# Patient Record
Sex: Female | Born: 1955 | Race: Black or African American | Hispanic: No | Marital: Married | Smoking: Never smoker
Health system: Southern US, Community
[De-identification: ages and names within clinical notes are randomized; demographics above are authoritative.]

## PROBLEM LIST (undated history)

## (undated) DIAGNOSIS — I10 Essential (primary) hypertension: Secondary | ICD-10-CM

## (undated) DIAGNOSIS — E119 Type 2 diabetes mellitus without complications: Secondary | ICD-10-CM

---

## 2002-09-09 ENCOUNTER — Encounter: Payer: Self-pay | Admitting: Obstetrics

## 2002-09-09 ENCOUNTER — Ambulatory Visit (HOSPITAL_COMMUNITY): Admission: RE | Admit: 2002-09-09 | Discharge: 2002-09-09 | Payer: Self-pay | Admitting: Obstetrics

## 2002-09-30 ENCOUNTER — Encounter: Admission: RE | Admit: 2002-09-30 | Discharge: 2002-09-30 | Payer: Self-pay | Admitting: Cardiology

## 2002-09-30 ENCOUNTER — Encounter: Payer: Self-pay | Admitting: Cardiology

## 2004-02-10 ENCOUNTER — Ambulatory Visit (HOSPITAL_COMMUNITY): Admission: RE | Admit: 2004-02-10 | Discharge: 2004-02-10 | Payer: Self-pay | Admitting: Cardiology

## 2004-02-10 IMAGING — CR DG CHEST 2V
2 series · 2 of 2 positions shown · non-contrast
Comparison: none

CLINICAL DATA: Positive TB test.
 CHEST, TWO VIEWS 
 PA and lateral views reveal the heart size to be normal.  Linear atelectasis is noted on the left.  No acute abnormality.
 IMPRESSION
 No active disease.

[view not recorded (1 of 2)]
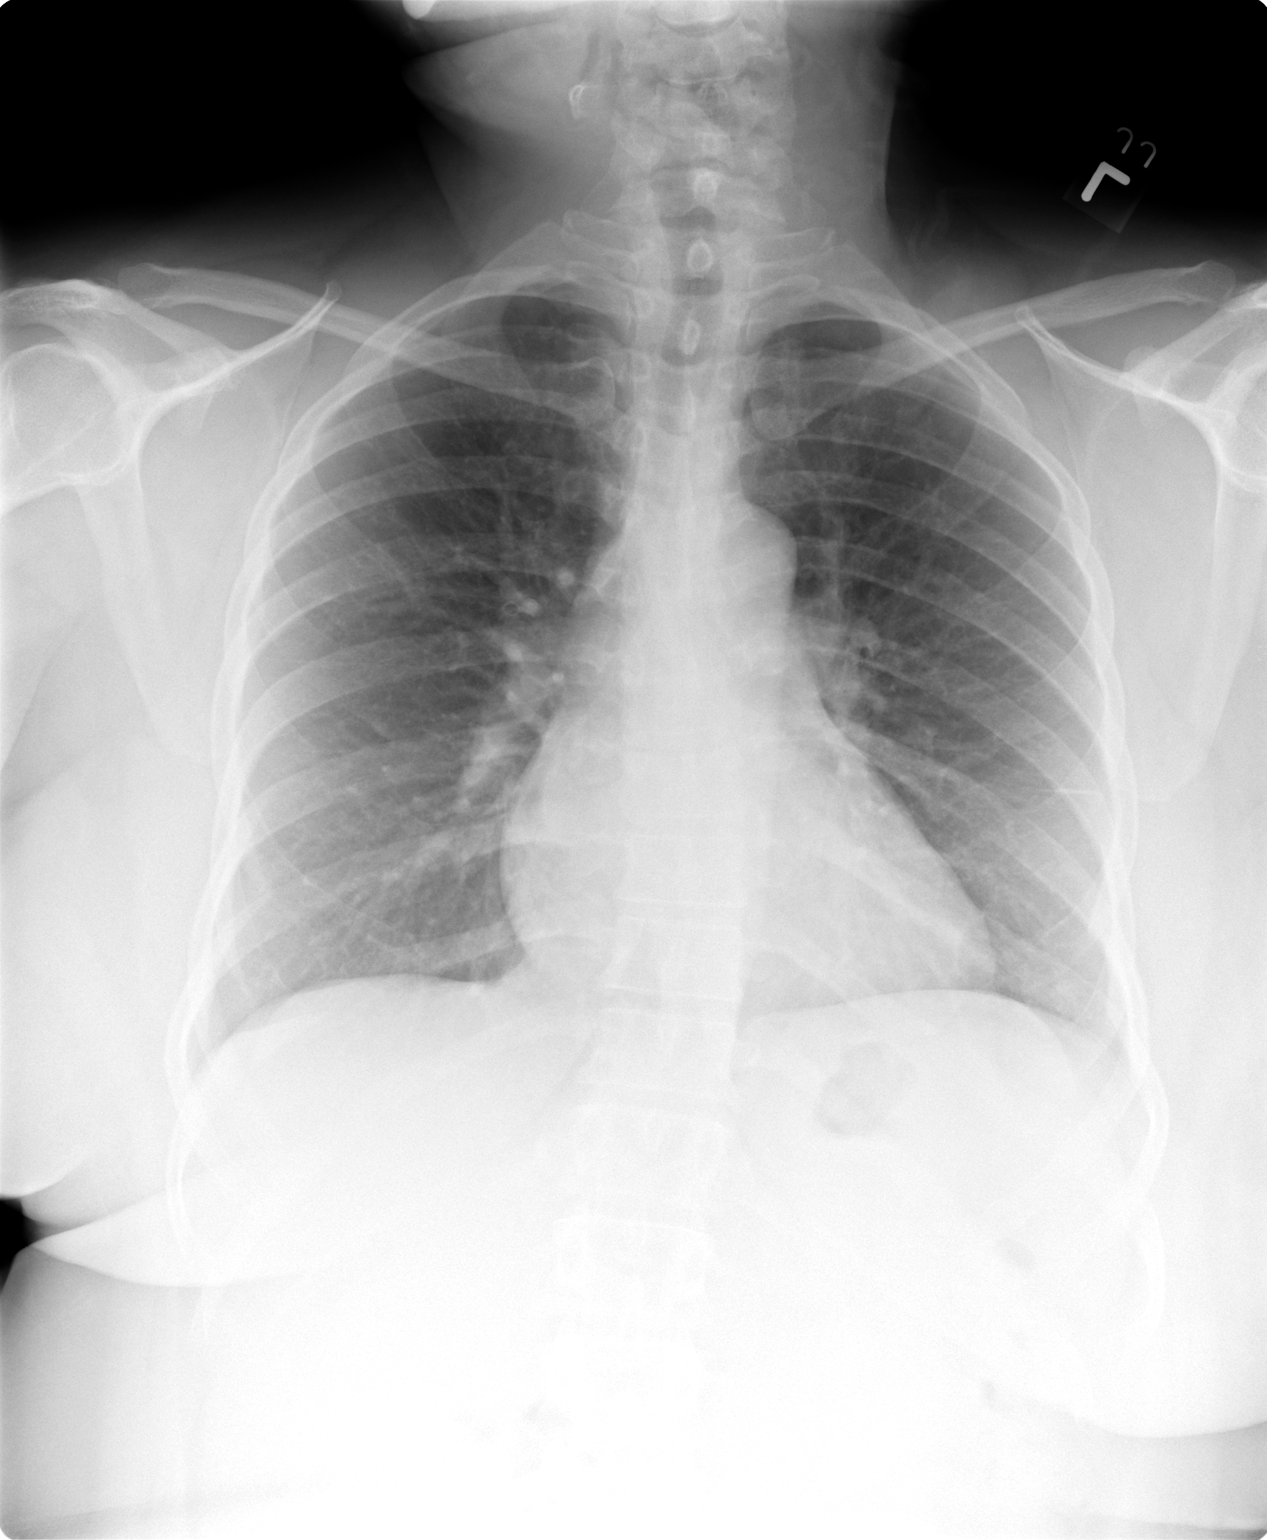

[view not recorded (2 of 2)]
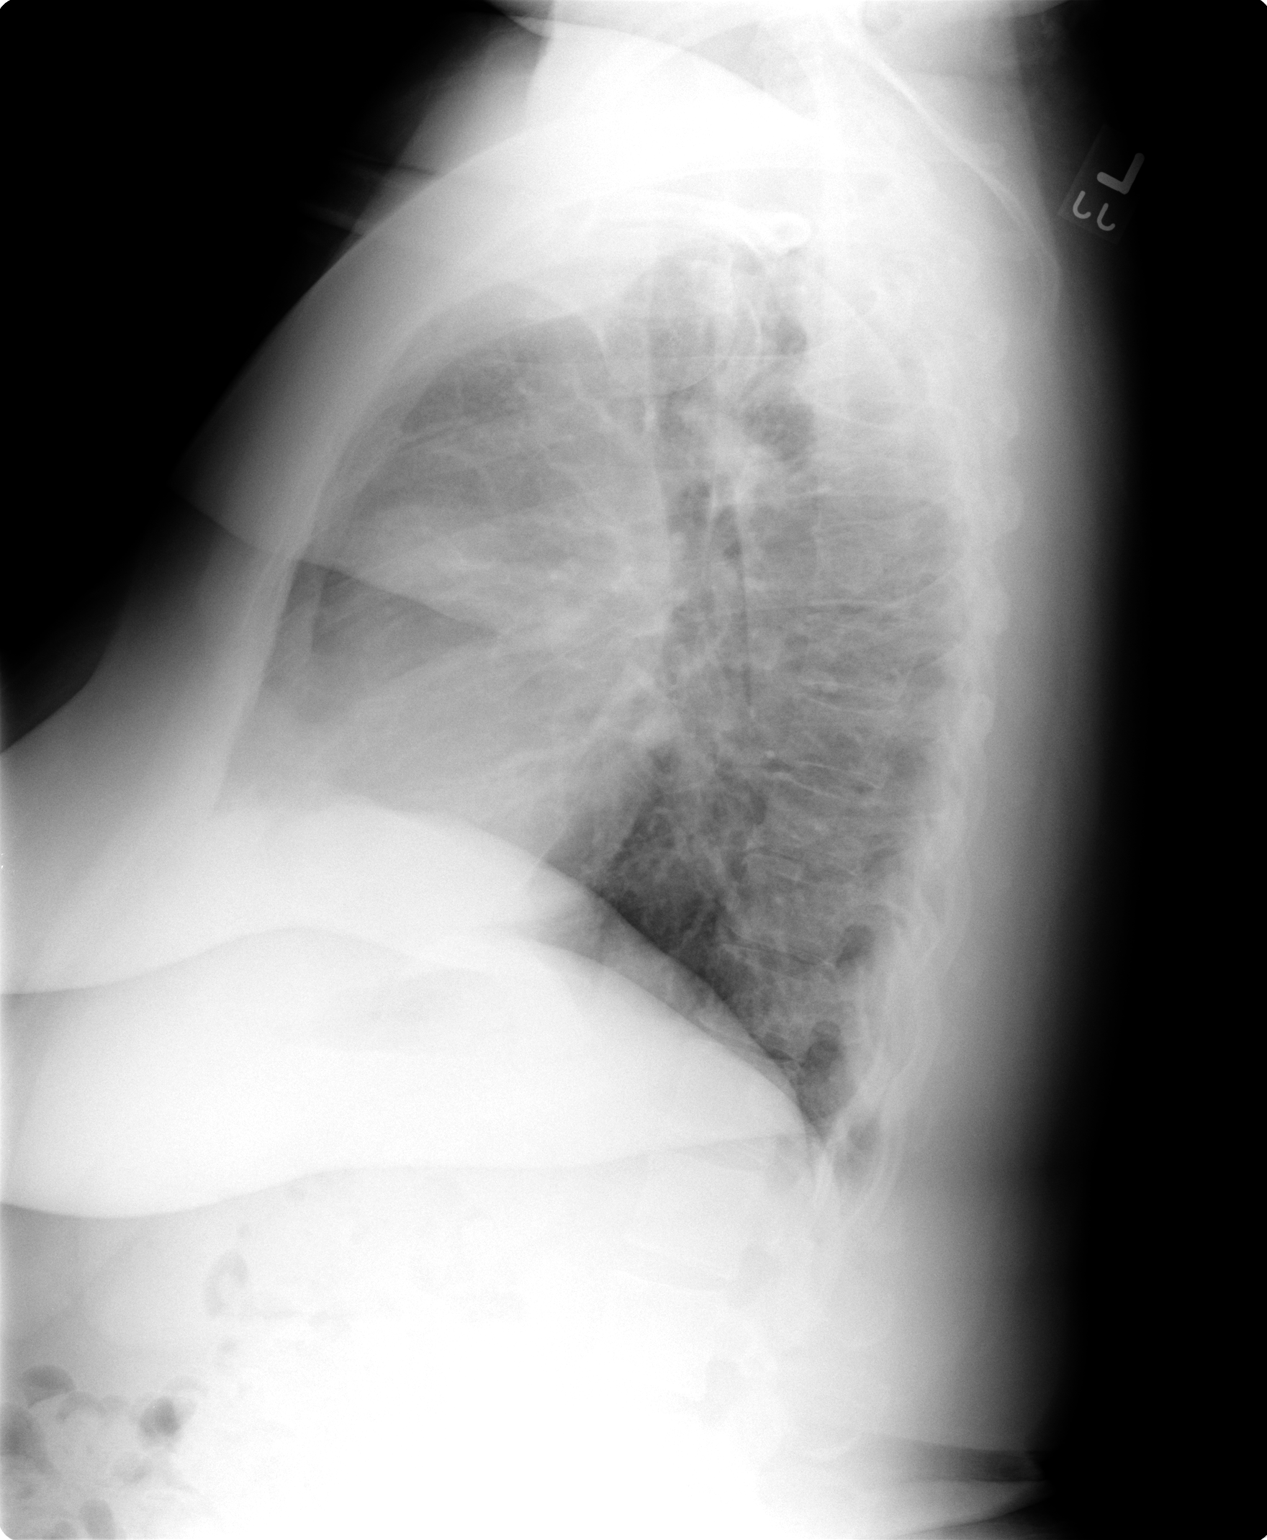

[2 of 2 positions shown; findings below may reference images not displayed]

## 2012-11-20 ENCOUNTER — Other Ambulatory Visit: Payer: Self-pay | Admitting: Occupational Medicine

## 2012-11-20 ENCOUNTER — Ambulatory Visit: Payer: Self-pay

## 2012-11-20 DIAGNOSIS — R7611 Nonspecific reaction to tuberculin skin test without active tuberculosis: Secondary | ICD-10-CM

## 2012-11-20 IMAGING — CR DG CHEST 1V
1 series · 1 of 1 positions shown · non-contrast
Comparison: [DATE]

CLINICAL DATA: Positive TB skin test

CHEST - 1 VIEW

[view not recorded]
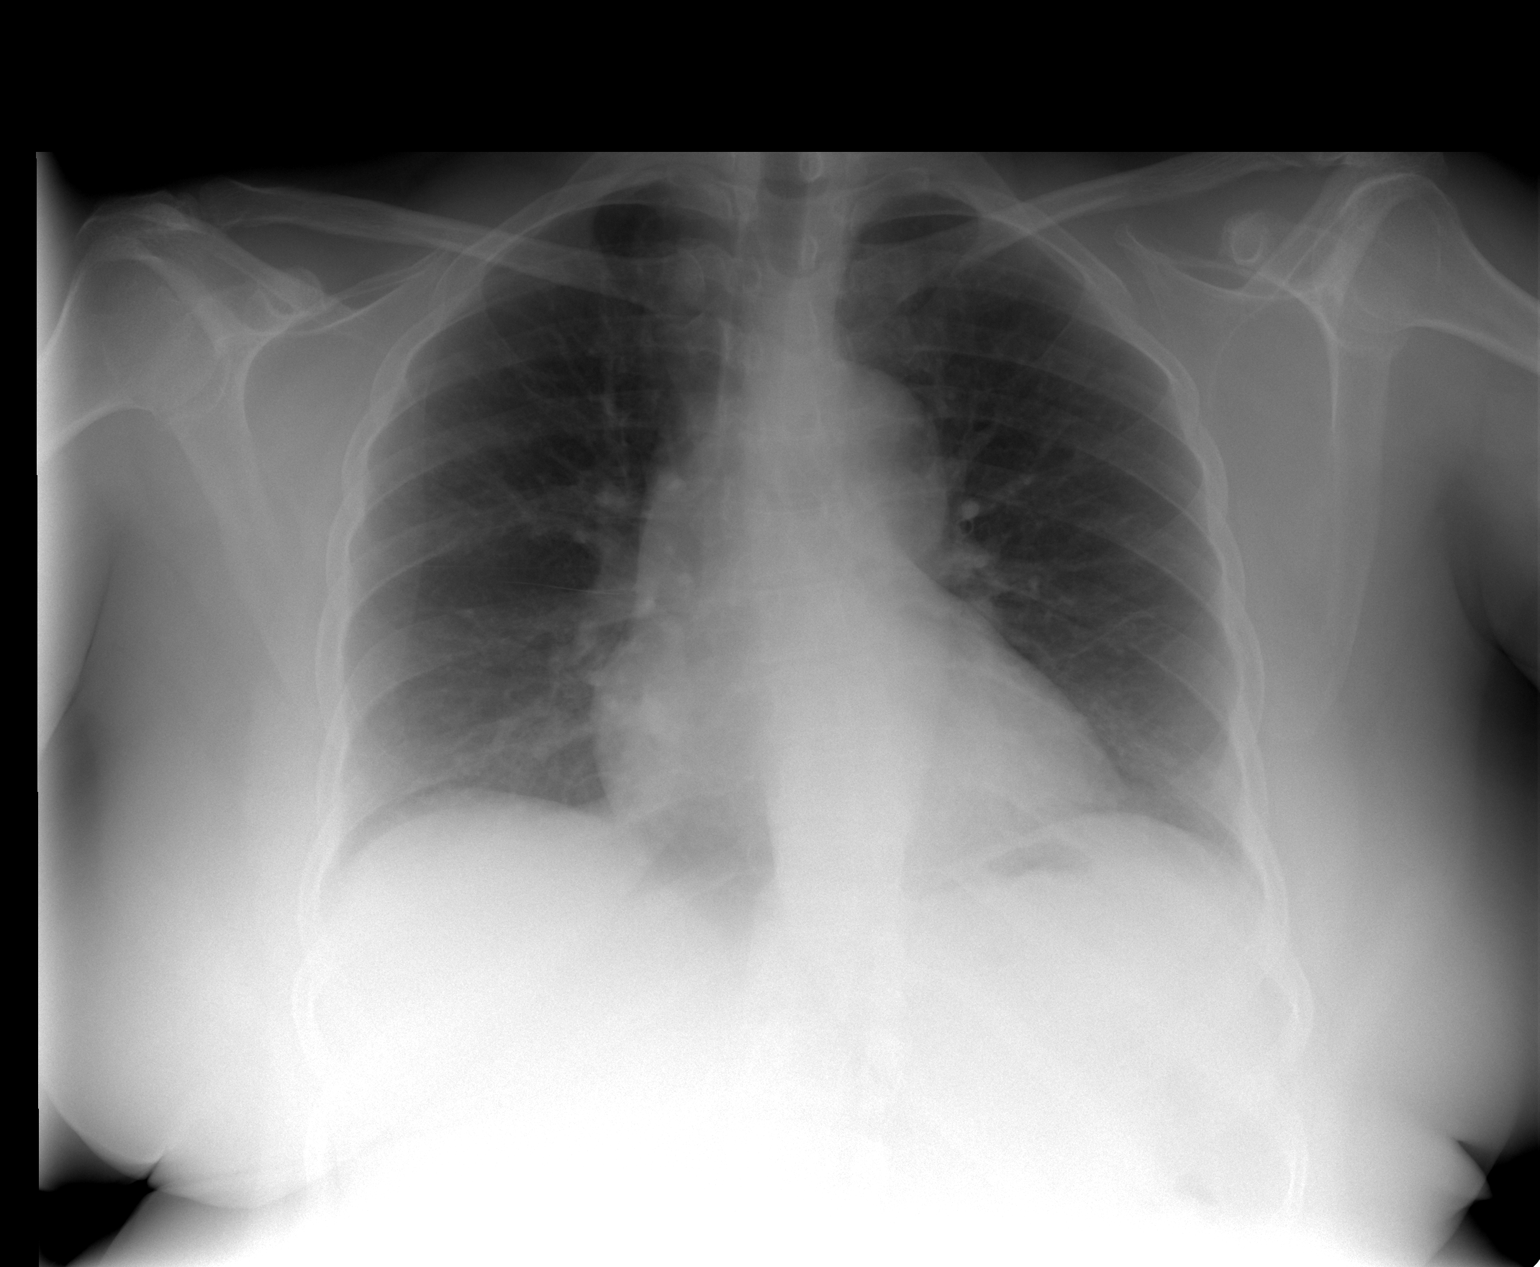

[1 of 1 positions shown; findings below may reference images not displayed]

FINDINGS: Lungs are clear. No pleural effusion or pneumothorax.

Heart is top normal in size.

Mild thoracic levoscoliosis.
IMPRESSION: No evidence of acute cardiopulmonary disease.

## 2014-11-11 ENCOUNTER — Other Ambulatory Visit: Payer: Self-pay | Admitting: Cardiology

## 2014-11-11 DIAGNOSIS — Z1231 Encounter for screening mammogram for malignant neoplasm of breast: Secondary | ICD-10-CM

## 2014-12-09 ENCOUNTER — Ambulatory Visit: Payer: Self-pay

## 2014-12-09 ENCOUNTER — Other Ambulatory Visit: Payer: Self-pay | Admitting: Cardiology

## 2014-12-09 DIAGNOSIS — Z1231 Encounter for screening mammogram for malignant neoplasm of breast: Secondary | ICD-10-CM

## 2014-12-23 ENCOUNTER — Ambulatory Visit
Admission: RE | Admit: 2014-12-23 | Discharge: 2014-12-23 | Disposition: A | Discharge status: Home / Self Care | Payer: 59 | Source: Ambulatory Visit | Attending: Cardiology | Admitting: Cardiology

## 2014-12-23 ENCOUNTER — Encounter (INDEPENDENT_AMBULATORY_CARE_PROVIDER_SITE_OTHER): Payer: Self-pay

## 2014-12-23 DIAGNOSIS — Z1231 Encounter for screening mammogram for malignant neoplasm of breast: Secondary | ICD-10-CM

## 2014-12-30 ENCOUNTER — Encounter (HOSPITAL_COMMUNITY): Payer: Self-pay | Admitting: Emergency Medicine

## 2014-12-30 DIAGNOSIS — I10 Essential (primary) hypertension: Secondary | ICD-10-CM | POA: Insufficient documentation

## 2014-12-30 DIAGNOSIS — R011 Cardiac murmur, unspecified: Secondary | ICD-10-CM | POA: Diagnosis not present

## 2014-12-30 DIAGNOSIS — E1165 Type 2 diabetes mellitus with hyperglycemia: Secondary | ICD-10-CM | POA: Diagnosis not present

## 2014-12-30 DIAGNOSIS — Z792 Long term (current) use of antibiotics: Secondary | ICD-10-CM | POA: Insufficient documentation

## 2014-12-30 DIAGNOSIS — Z7951 Long term (current) use of inhaled steroids: Secondary | ICD-10-CM | POA: Diagnosis not present

## 2014-12-30 DIAGNOSIS — R630 Anorexia: Secondary | ICD-10-CM | POA: Insufficient documentation

## 2014-12-30 DIAGNOSIS — Z79899 Other long term (current) drug therapy: Secondary | ICD-10-CM | POA: Diagnosis not present

## 2014-12-30 DIAGNOSIS — R110 Nausea: Secondary | ICD-10-CM | POA: Diagnosis present

## 2014-12-30 LAB — URINALYSIS, ROUTINE W REFLEX MICROSCOPIC
Bilirubin Urine: NEGATIVE
Glucose, UA: >1000 mg/dL — AB
Ketones, ur: NEGATIVE mg/dL
LEUKOCYTES UA: NEGATIVE
Nitrite: NEGATIVE
PROTEIN: NEGATIVE mg/dL
SPECIFIC GRAVITY, URINE: 1.009 (ref 1.005–1.030)
Urobilinogen, UA: 0.2 mg/dL (ref 0.0–1.0)
pH: 6 (ref 5.0–8.0)

## 2014-12-30 LAB — URINE MICROSCOPIC-ADD ON

## 2014-12-30 LAB — CBC WITH DIFFERENTIAL/PLATELET
Basophils Absolute: 0 10*3/uL (ref 0.0–0.1)
Basophils Relative: 0 % (ref 0–1)
Eosinophils Absolute: 0.2 10*3/uL (ref 0.0–0.7)
Eosinophils Relative: 1 % (ref 0–5)
HCT: 38.2 % (ref 36.0–46.0)
HEMOGLOBIN: 13.8 g/dL (ref 12.0–15.0)
Lymphocytes Relative: 37 % (ref 12–46)
Lymphs Abs: 4.2 10*3/uL — ABNORMAL HIGH (ref 0.7–4.0)
MCH: 29.2 pg (ref 26.0–34.0)
MCHC: 36.1 g/dL — ABNORMAL HIGH (ref 30.0–36.0)
MCV: 80.8 fL (ref 78.0–100.0)
MONOS PCT: 4 % (ref 3–12)
Monocytes Absolute: 0.5 10*3/uL (ref 0.1–1.0)
NEUTROS ABS: 6.6 10*3/uL (ref 1.7–7.7)
NEUTROS PCT: 58 % (ref 43–77)
Platelets: 304 10*3/uL (ref 150–400)
RBC: 4.73 MIL/uL (ref 3.87–5.11)
RDW: 12.6 % (ref 11.5–15.5)
WBC: 11.4 10*3/uL — ABNORMAL HIGH (ref 4.0–10.5)

## 2014-12-30 NOTE — ED Notes (Addendum)
PT was given cipro for cough by PCP; 3 days left; been having upset stomach; hypertensive; PT has not been dx with diabetes but used husbands monitor to check it bc had blurry vision Sunday. Registered "high". This morning CBG 489 and tonight was 389.

## 2014-12-31 ENCOUNTER — Emergency Department (HOSPITAL_COMMUNITY)
Admission: EM | Admit: 2014-12-31 | Discharge: 2014-12-31 | Disposition: A | Discharge status: Home / Self Care | Payer: 59 | Attending: Emergency Medicine | Admitting: Emergency Medicine

## 2014-12-31 DIAGNOSIS — E1165 Type 2 diabetes mellitus with hyperglycemia: Secondary | ICD-10-CM

## 2014-12-31 HISTORY — DX: Essential (primary) hypertension: I10

## 2014-12-31 LAB — COMPREHENSIVE METABOLIC PANEL
ALBUMIN: 4.1 g/dL (ref 3.5–5.0)
ALK PHOS: 95 U/L (ref 38–126)
ALT: 43 U/L (ref 14–54)
ANION GAP: 14 (ref 5–15)
AST: 40 U/L (ref 15–41)
BUN: 17 mg/dL (ref 6–20)
CO2: 23 mmol/L (ref 22–32)
Calcium: 10.7 mg/dL — ABNORMAL HIGH (ref 8.9–10.3)
Chloride: 91 mmol/L — ABNORMAL LOW (ref 101–111)
Creatinine, Ser: 0.93 mg/dL (ref 0.44–1.00)
GFR calc Af Amer: >60 mL/min (ref >60)
GFR calc non Af Amer: >60 mL/min (ref >60)
Glucose, Bld: 465 mg/dL — ABNORMAL HIGH (ref 70–99)
POTASSIUM: 3.3 mmol/L — AB (ref 3.5–5.1)
SODIUM: 128 mmol/L — AB (ref 135–145)
TOTAL PROTEIN: 7.3 g/dL (ref 6.5–8.1)
Total Bilirubin: 0.8 mg/dL (ref 0.3–1.2)

## 2014-12-31 LAB — CBG MONITORING, ED
GLUCOSE-CAPILLARY: 438 mg/dL — AB (ref 70–99)
Glucose-Capillary: 312 mg/dL — ABNORMAL HIGH (ref 70–99)

## 2014-12-31 MED ORDER — ACETAMINOPHEN 325 MG PO TABS
650.0000 mg | ORAL_TABLET | Freq: Once | ORAL | Status: AC
Start: 2014-12-31 — End: 2014-12-31
  Administered 2014-12-31: 650 mg via ORAL
  Filled 2014-12-31: qty 2

## 2014-12-31 MED ORDER — SODIUM CHLORIDE 0.9 % IV SOLN
1000.0000 mL | Freq: Once | INTRAVENOUS | Status: AC
  Administered 2014-12-31: 1000 mL via INTRAVENOUS

## 2014-12-31 MED ORDER — ONDANSETRON HCL 4 MG/2ML IJ SOLN
4.0000 mg | Freq: Once | INTRAMUSCULAR | Status: AC
  Administered 2014-12-31: 4 mg via INTRAVENOUS
  Filled 2014-12-31: qty 2

## 2014-12-31 MED ORDER — METFORMIN HCL 500 MG PO TABS
500.0000 mg | ORAL_TABLET | Freq: Once | ORAL | Status: AC
  Administered 2014-12-31: 500 mg via ORAL
  Filled 2014-12-31: qty 1

## 2014-12-31 MED ORDER — ONDANSETRON 4 MG PO TBDP: 4.0000 mg | ORAL_TABLET | Freq: Three times a day (TID) | ORAL | Status: AC | PRN

## 2014-12-31 MED ORDER — SODIUM CHLORIDE 0.9 % IV SOLN
1000.0000 mL | INTRAVENOUS | Status: DC
  Administered 2014-12-31: 1000 mL via INTRAVENOUS

## 2014-12-31 MED ORDER — METFORMIN HCL 500 MG PO TABS: 500.0000 mg | ORAL_TABLET | Freq: Two times a day (BID) | ORAL | Status: AC

## 2014-12-31 NOTE — Discharge Instructions (Signed)
Please read and follow all provided instructions.  Your diagnoses today include:  1. Hyperglycemia due to type 2 diabetes mellitus     Tests performed today include:  Blood counts and electrolytes - shows high blood sugar  Urine test - shows sugar, no infection  Vital signs. See below for your results today.   Medications prescribed:   Metformin - medication for high blood sugar   Zofran (ondansetron) - for nausea and vomiting  Take any prescribed medications only as directed.  Home care instructions:  Follow any educational materials contained in this packet.  BE VERY CAREFUL not to take multiple medicines containing Tylenol (also called acetaminophen). Doing so can lead to an overdose which can damage your liver and cause liver failure and possibly death.   Follow-up instructions: Please follow-up with your primary care provider in the next 3 days for further evaluation of your symptoms.   Return instructions:   Please return to the Emergency Department if you experience worsening symptoms.   Please return if you have any other emergent concerns.  Additional Information:  Your vital signs today were: BP 140/82 mmHg   Pulse 92   Temp(Src) 98.7 F (37.1 C) (Oral)   Resp 16   Ht 5\' 4"  (1.626 m)   Wt 187 lb (84.823 kg)   BMI 32.08 kg/m2   SpO2 100% If your blood pressure (BP) was elevated above 135/85 this visit, please have this repeated by your doctor within one month. --------------

## 2014-12-31 NOTE — ED Notes (Signed)
Pt ambulated to the bathroom.  

## 2014-12-31 NOTE — ED Provider Notes (Signed)
CSN: [id number]     Arrival date & time 12/30/14  2230 History   First MD Initiated Contact with Patient 12/31/14 0600     Chief Complaint  Patient presents with  . Nausea     (Consider location/radiation/quality/duration/timing/severity/associated sxs/prior Treatment) HPI Comments: Patient with h/o HTN -- presents with c/o nausea, blurry vision x 3 days. Patient notes that she checked her blood sugar on her husbands machine and it read high. She does not have a history of DM. Patient starting having a cough 1 week ago. She was prescribed cipro and her cough improved but is not fully resolved. Patient notes increased thirst and urination. No chest pain, shortness of breath, vomiting, or diarrhea. No treatments prior to arrival. Patient denies signs of stroke including: facial droop, slurred speech, aphasia, weakness/numbness in extremities, imbalance/trouble walking.   The history is provided by the patient.    Past Medical History  Diagnosis Date  . Hypertension    History reviewed. No pertinent past surgical history. History reviewed. No pertinent family history. History  Substance Use Topics  . Smoking status: Not on file  . Smokeless tobacco: Not on file  . Alcohol Use: Not on file   OB History    No data available     Review of Systems  Constitutional: Positive for appetite change. Negative for fever.  HENT: Negative for rhinorrhea and sore throat.   Eyes: Negative for redness.  Respiratory: Positive for cough.   Cardiovascular: Negative for chest pain.  Gastrointestinal: Positive for nausea. Negative for vomiting, abdominal pain and diarrhea.  Endocrine: Positive for polydipsia and polyuria. Negative for polyphagia.  Genitourinary: Negative for dysuria.  Musculoskeletal: Negative for myalgias.  Skin: Negative for rash.  Neurological: Negative for headaches.     Allergies  Review of patient's allergies indicates no known allergies.  Home Medications   Prior to  Admission medications   Medication Sig Start Date End Date Taking? Authorizing Provider  amLODipine (NORVASC) 5 MG tablet Take 5 mg by mouth 2 (two) times daily.   Yes Historical Provider, MD  Cholecalciferol (VITAMIN D PO) Take 1 tablet by mouth daily.   Yes Historical Provider, MD  ciprofloxacin (CIPRO) 500 MG tablet Take 500 mg by mouth 2 (two) times daily.   Yes Historical Provider, MD  cloNIDine (CATAPRES) 0.2 MG tablet Take 0.2 mg by mouth 2 (two) times daily.   Yes Historical Provider, MD  Cyanocobalamin (VITAMIN B-12 PO) Take 1 tablet by mouth daily.   Yes Historical Provider, MD  fluticasone (FLONASE) 50 MCG/ACT nasal spray Place 1 spray into both nostrils daily.   Yes Historical Provider, MD  hydrochlorothiazide (HYDRODIURIL) 25 MG tablet Take 25 mg by mouth daily.   Yes Historical Provider, MD  losartan (COZAAR) 100 MG tablet Take 100 mg by mouth daily.   Yes Historical Provider, MD  MAGNESIUM PO Take 1 tablet by mouth daily.   Yes Historical Provider, MD  traZODone (DESYREL) 50 MG tablet Take 50 mg by mouth at bedtime.   Yes Historical Provider, MD   BP 160/102 mmHg  Pulse 92  Temp(Src) 98.7 F (37.1 C) (Oral)  Resp 16  Ht 5\' 4"  (1.626 m)  Wt 187 lb (84.823 kg)  BMI 32.08 kg/m2  SpO2 100%   Physical Exam  Constitutional: She appears well-developed and well-nourished.  HENT:  Head: Normocephalic and atraumatic.  Eyes: Conjunctivae are normal. Right eye exhibits no discharge. Left eye exhibits no discharge.  Neck: Normal range of motion. Neck supple.  Cardiovascular: Normal rate and regular rhythm.  Exam reveals gallop.   Murmur heard. Pulmonary/Chest: Effort normal and breath sounds normal. No respiratory distress. She has no wheezes. She has no rales.  Abdominal: Soft. There is no tenderness. There is no rebound and no guarding.  Neurological: She is alert.  Skin: Skin is warm and dry.  Psychiatric: She has a normal mood and affect.  Nursing note and vitals  reviewed.   ED Course  Procedures (including critical care time) Labs Review Labs Reviewed  CBC WITH DIFFERENTIAL/PLATELET - Abnormal; Notable for the following:    WBC 11.4 (*)    MCHC 36.1 (*)    Lymphs Abs 4.2 (*)    All other components within normal limits  COMPREHENSIVE METABOLIC PANEL - Abnormal; Notable for the following:    Sodium 128 (*)    Potassium 3.3 (*)    Chloride 91 (*)    Glucose, Bld 465 (*)    Calcium 10.7 (*)    All other components within normal limits  URINALYSIS, ROUTINE W REFLEX MICROSCOPIC - Abnormal; Notable for the following:    Color, Urine STRAW (*)    Glucose, UA >1000 (*)    Hgb urine dipstick TRACE (*)    All other components within normal limits  URINE MICROSCOPIC-ADD ON - Abnormal; Notable for the following:    Squamous Epithelial / LPF FEW (*)    All other components within normal limits  CBG MONITORING, ED - Abnormal; Notable for the following:    Glucose-Capillary 438 (*)    All other components within normal limits  CBG MONITORING, ED - Abnormal; Notable for the following:    Glucose-Capillary 312 (*)    All other components within normal limits  CBG MONITORING, ED    Imaging Review No results found.   EKG Interpretation None      6:32 AM Patient seen and examined. Will give 2L NS and re-eval. Work-up reviewed with patient.    Vital signs reviewed and are as follows: BP 160/102 mmHg  Pulse 92  Temp(Src) 98.7 F (37.1 C) (Oral)  Resp 16  Ht 5\' 4"  (1.626 m)  Wt 187 lb (84.823 kg)  BMI 32.08 kg/m2  SpO2 100%   Patient and family counseled on diet, exercise, blood pressure monitoring at bedside.   9:31 AM CBG improved to 300 after 2L NS. Pt feeling somewhat better. Encouraged her to check and track blood sugars. Will start on metformin 500 mg twice a day. Discussed side effects and how to adjust dosing if she feels better on this medication. Encouraged PCP follow-up in the next 1 week.   Patient urged to return with  worsening symptoms or other concerns. Patient verbalized understanding and agrees with plan.    MDM   Final diagnoses:  Hyperglycemia due to type 2 diabetes mellitus   Patient with vague symptoms of nausea, blurred vision in setting of new onset high blood sugars. Patient was treated in emergency department with IV fluids with improvement. She has expected electrolyte abnormalities. No vomiting. No signs of DKA. Normal anion gap. Patient treated with fluids. Blood pressures have been running somewhat high, the patient has not had her BP meds this morning. No concern for stroke with otherwise normal neuro exam, other explanation.    Carlisle Cater, PA-C 12/31/14 [place], MD 01/04/15 [phone]

## 2020-11-30 ENCOUNTER — Encounter (HOSPITAL_COMMUNITY): Payer: Self-pay

## 2020-11-30 ENCOUNTER — Emergency Department (HOSPITAL_COMMUNITY)
Admission: EM | Admit: 2020-11-30 | Discharge: 2020-11-30 | Disposition: A | Discharge status: Home / Self Care | Payer: Managed Care, Other (non HMO) | Attending: Emergency Medicine | Admitting: Emergency Medicine

## 2020-11-30 ENCOUNTER — Emergency Department (HOSPITAL_COMMUNITY): Payer: Managed Care, Other (non HMO)

## 2020-11-30 ENCOUNTER — Other Ambulatory Visit: Payer: Self-pay

## 2020-11-30 DIAGNOSIS — Y92410 Unspecified street and highway as the place of occurrence of the external cause: Secondary | ICD-10-CM | POA: Insufficient documentation

## 2020-11-30 DIAGNOSIS — I10 Essential (primary) hypertension: Secondary | ICD-10-CM | POA: Diagnosis not present

## 2020-11-30 DIAGNOSIS — M542 Cervicalgia: Secondary | ICD-10-CM | POA: Insufficient documentation

## 2020-11-30 DIAGNOSIS — Z79899 Other long term (current) drug therapy: Secondary | ICD-10-CM | POA: Insufficient documentation

## 2020-11-30 DIAGNOSIS — Z7984 Long term (current) use of oral hypoglycemic drugs: Secondary | ICD-10-CM | POA: Diagnosis not present

## 2020-11-30 DIAGNOSIS — E119 Type 2 diabetes mellitus without complications: Secondary | ICD-10-CM | POA: Insufficient documentation

## 2020-11-30 DIAGNOSIS — D329 Benign neoplasm of meninges, unspecified: Secondary | ICD-10-CM | POA: Diagnosis not present

## 2020-11-30 DIAGNOSIS — R519 Headache, unspecified: Secondary | ICD-10-CM | POA: Diagnosis present

## 2020-11-30 HISTORY — DX: Type 2 diabetes mellitus without complications: E11.9

## 2020-11-30 LAB — I-STAT CHEM 8, ED
BUN: 5 mg/dL — ABNORMAL LOW (ref 8–23)
Calcium, Ion: 1.18 mmol/L (ref 1.15–1.40)
Chloride: 108 mmol/L (ref 98–111)
Creatinine, Ser: 0.6 mg/dL (ref 0.44–1.00)
Glucose, Bld: 112 mg/dL — ABNORMAL HIGH (ref 70–99)
HCT: 44 % (ref 36.0–46.0)
Hemoglobin: 15 g/dL (ref 12.0–15.0)
Potassium: 3.5 mmol/L (ref 3.5–5.1)
Sodium: 143 mmol/L (ref 135–145)
TCO2: 25 mmol/L (ref 22–32)

## 2020-11-30 IMAGING — MR MR HEAD WO/W CM
12 of 14 series · 40 of 48 positions shown · IV contrast (Eovist)
Comparison: Prior head CT from earlier the same day.

CLINICAL DATA: Initial evaluation for intracranial mass.

EXAM:
MRI HEAD WITHOUT AND WITH CONTRAST
TECHNIQUE: Multiplanar, multiecho pulse sequences of the brain and surrounding
structures were obtained without and with intravenous contrast.
CONTRAST:  7.5mL GADAVIST GADOBUTROL 1 MMOL/ML IV SOLN

[Series 5: DWI · axial · 3.0mm · 0.88mm/px · z∈[-144,-2]mm · 8 of 100 slices shown (1 of 4)]
[im 1/100]
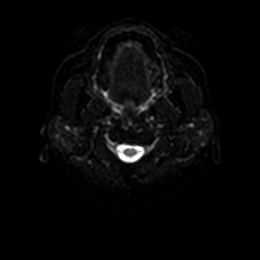
[im 15/100]
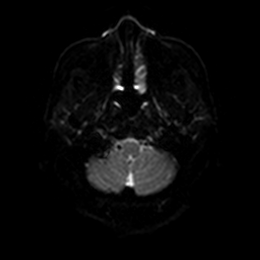
[im 29/100]
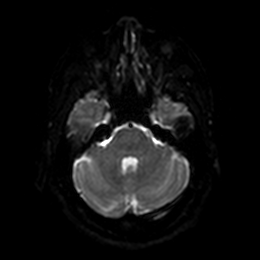
[im 43/100]
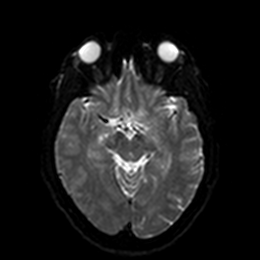
[im 57/100]
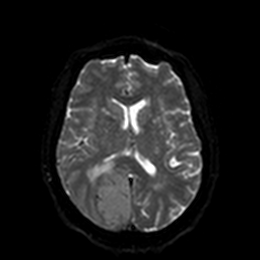
[im 71/100]
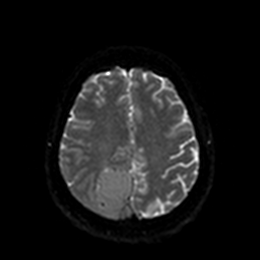
[im 85/100]
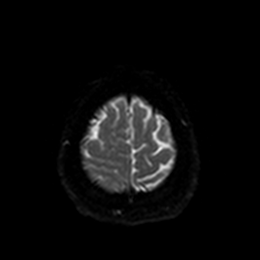
[im 100/100]
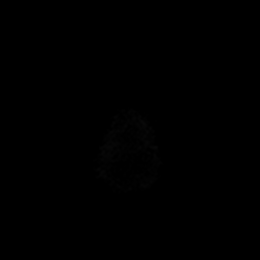

[Series 6: DWI · axial · 3.0mm · 0.88mm/px · z∈[-144,-2]mm · 4 of 50 slices shown (2 of 4)]
[im 1/50]
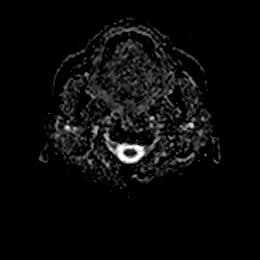
[im 17/50]
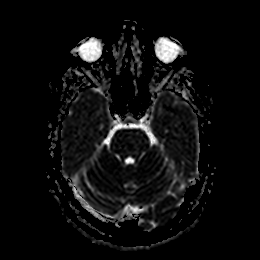
[im 33/50]
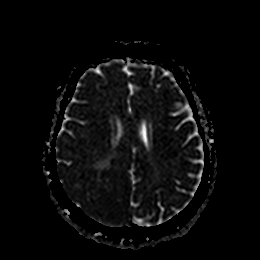
[im 50/50]
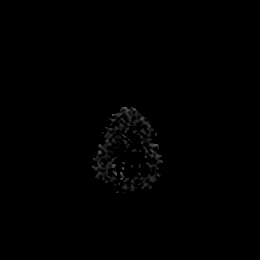

[Series 7: DWI · coronal · 4.0mm · 0.88mm/px · 5 of 68 slices shown (3 of 4)]
[im 1/68]
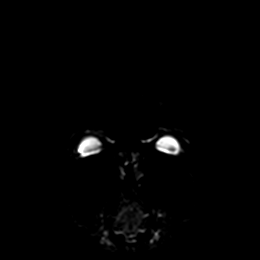
[im 17/68]
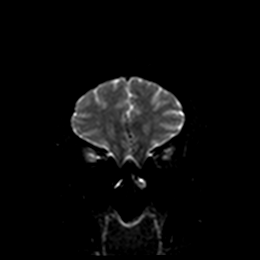
[im 34/68]
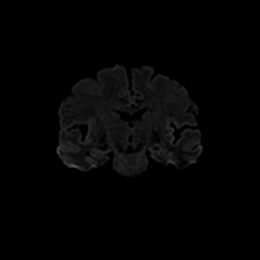
[im 51/68]
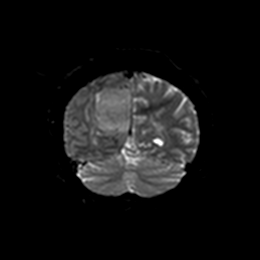
[im 68/68]
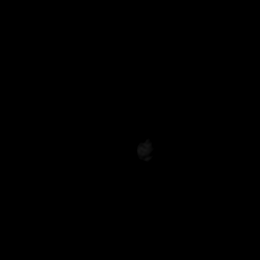

[Series 8: DWI · coronal · 4.0mm · 0.88mm/px · 3 of 34 slices shown (4 of 4)]
[im 1/34]
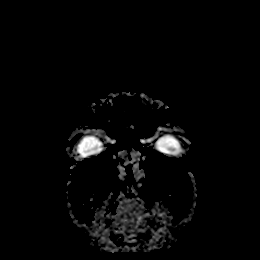
[im 17/34]
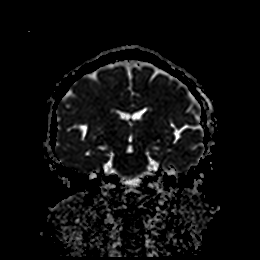
[im 34/34]
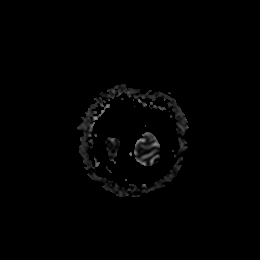

[Series 9: FLAIR · axial · 5.0mm · 0.45mm/px · z∈[-141,-2]mm · 2 of 25 slices shown]
[im 1/25]
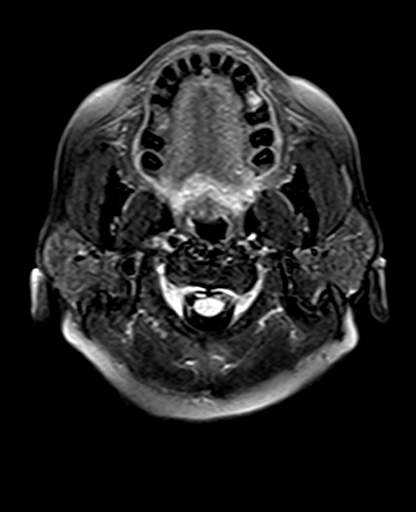
[im 25/25]
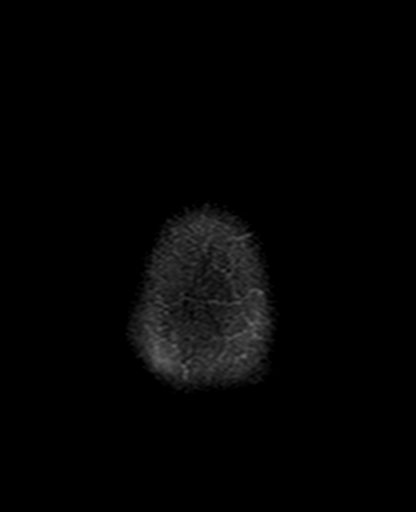

[Series 11: pha_images · axial · 3.0mm · 0.90mm/px · z∈[-143,-1]mm · 4 of 50 slices shown]
[im 1/50]
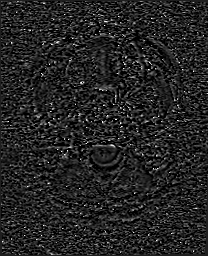
[im 17/50]
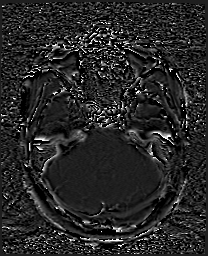
[im 33/50]
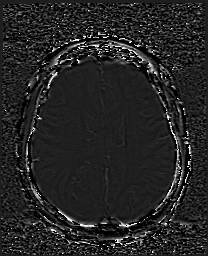
[im 50/50]
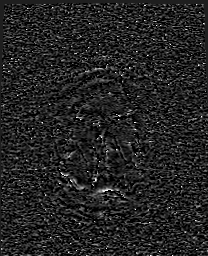

[Series 12: swi_images · axial · 3.0mm · 0.90mm/px · z∈[-143,+4]mm · 4 of 52 slices shown]
[im 1/52]
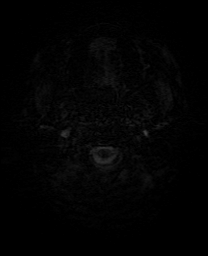
[im 18/52]
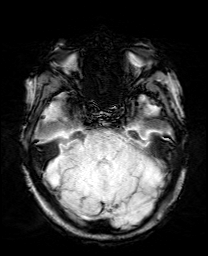
[im 35/52]
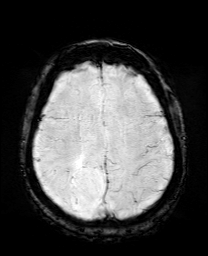
[im 52/52]
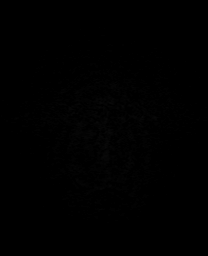

[Series 14: T1 · sagittal · 5.0mm · 0.75mm/px · 2 of 25 slices shown]
[im 1/25]
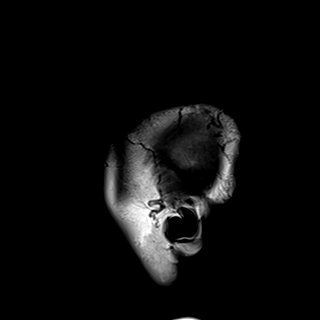
[im 25/25]
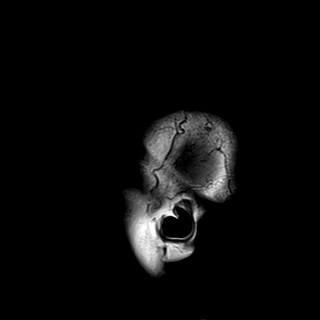

[Series 15: T2 · axial · 5.0mm · 0.72mm/px · z∈[-142,-4]mm · 2 of 25 slices shown]
[im 1/25]
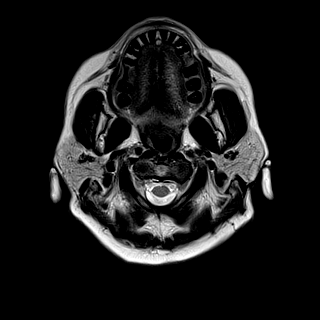
[im 25/25]
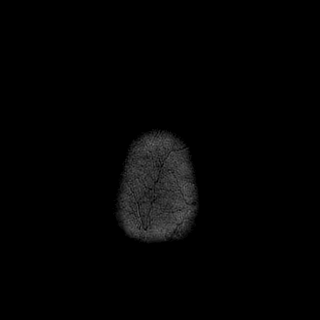

[Series 17: T2 post-contrast · coronal · 5.0mm · 0.72mm/px · 2 of 29 slices shown]
[im 1/29]
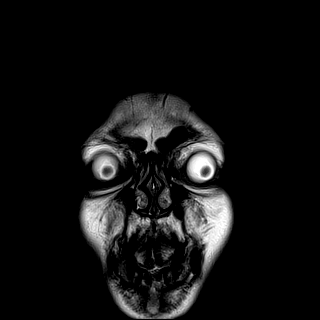
[im 29/29]
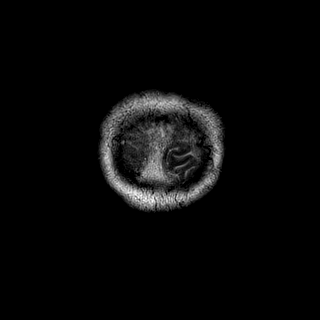

[Series 19: T1 post-contrast · coronal · 5.0mm · 0.34mm/px · 2 of 29 slices shown (1 of 2)]
[im 1/29]
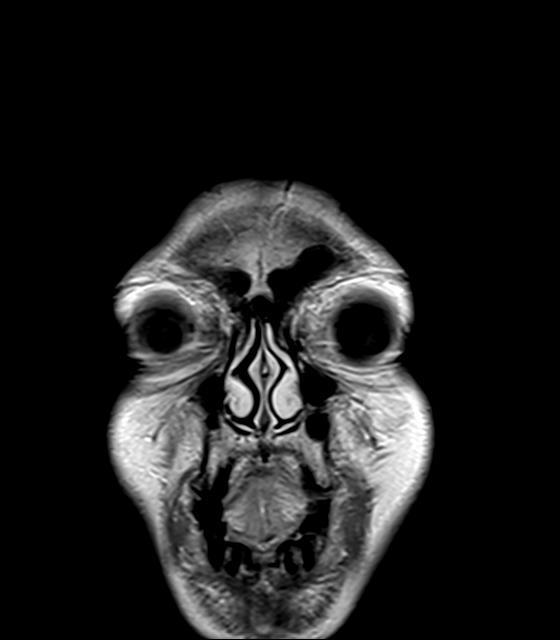
[im 29/29]
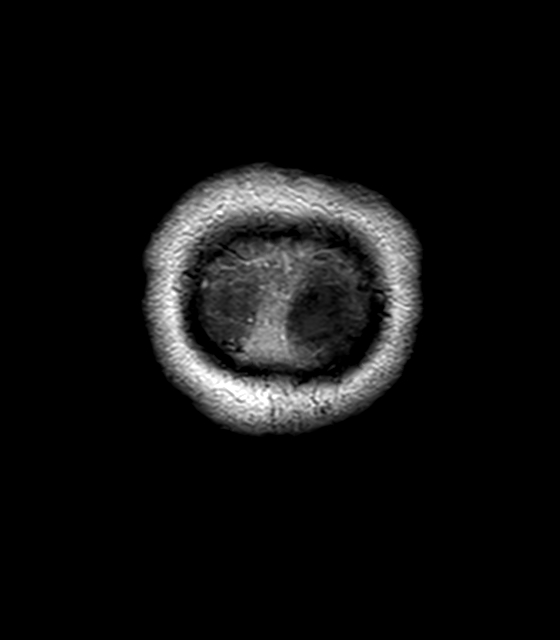

[Series 20: T1 post-contrast · sagittal · 5.0mm · 0.75mm/px · 2 of 25 slices shown (2 of 2)]
[im 1/25]
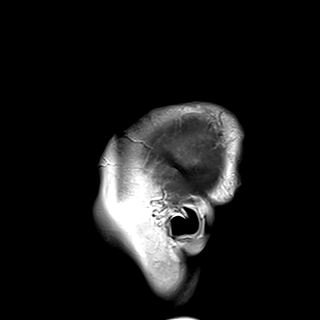
[im 25/25]
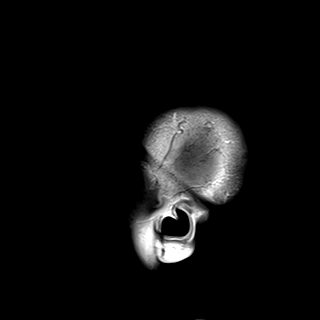

[40 of 48 positions shown; findings below may reference images not displayed]

FINDINGS: Brain: 3.3 x 4.7 x 4.8 cm lobulated well-circumscribed mass seen
positioned at the parasagittal right parieto-occipital convexity.
Lesion demonstrates fairly homogeneous T2 and FLAIR signal intensity
with solid postcontrast enhancement. Thin surrounding CSF cleft
noted, suggesting an extra-axial lesion. Appearance is most
characteristic of a meningioma. Associated dural tail/thickening
along the adjacent falx and posterior right cerebral convexity. Mass
closely approximates and narrows the adjacent posterosuperior
sagittal sinus, without definite sinus invasion, although evaluation
somewhat difficult on this exam. Mild surrounding vasogenic edema
with mass effect on the adjacent posterior right lateral ventricle.
Trace localized right-to-left shift.

Underlying cerebral volume within normal limits. Additional mild
scattered patchy T2/FLAIR hyperintensity involving the
supratentorial cerebral white matter noted, most like related
chronic microvascular ischemic disease, mild for age.

No evidence for acute or subacute infarct. Gray-white matter
differentiation otherwise maintained. No other areas of chronic
cortical infarction. No evidence for acute or chronic intracranial
hemorrhage.

No other mass lesion or mass effect. No hydrocephalus or extra-axial
fluid collection. Pituitary gland suprasellar region normal. Midline
structures intact. No other abnormal enhancement.

Vascular: Major intracranial vascular flow voids are maintained.

Skull and upper cervical spine: Craniocervical junction within
normal limits. Bone marrow signal intensity normal. No scalp soft
tissue abnormality.

Sinuses/Orbits: Globes and orbital soft tissues within normal
limits. Paranasal sinuses are largely clear. No mastoid effusion.
Inner ear structures grossly normal.

Other: None.
IMPRESSION: 1. 3.3 x 4.7 x 4.8 cm mass positioned at the parasagittal right
parieto-occipital convexity, most characteristic for a meningioma.
Mild surrounding vasogenic edema and regional mass effect. Mass
closely approximates and narrows the adjacent superior sagittal
sinus, without definite sinus invasion.
2. No other acute intracranial abnormality.
3. Underlying mild chronic microvascular ischemic disease.
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
## 2020-11-30 MED ORDER — DEXAMETHASONE 4 MG PO TABS: 4.0000 mg | ORAL_TABLET | Freq: Two times a day (BID) | ORAL | 0 refills | Status: AC

## 2020-11-30 MED ORDER — DEXAMETHASONE 4 MG PO TABS
4.0000 mg | ORAL_TABLET | Freq: Once | ORAL | Status: AC
  Administered 2020-11-30: 4 mg via ORAL
  Filled 2020-11-30: qty 1

## 2020-11-30 MED ORDER — GADOBUTROL 1 MMOL/ML IV SOLN
7.5000 mL | Freq: Once | INTRAVENOUS | Status: AC | PRN
  Administered 2020-11-30: 7.5 mL via INTRAVENOUS

## 2020-11-30 NOTE — ED Notes (Signed)
Assumed care of this patient. A&Ox4. Respirations regular/unlabored. NAD. PIV/labs obtained. Awaiting MRI.
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
## 2020-11-30 NOTE — ED Notes (Signed)
Patient requesting to speak with MD. MD aware.

## 2020-11-30 NOTE — ED Notes (Signed)
Pt sent to MRI. NAD.
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
## 2021-02-03 ENCOUNTER — Other Ambulatory Visit (HOSPITAL_COMMUNITY): Payer: Self-pay | Admitting: Neurological Surgery

## 2021-02-03 DIAGNOSIS — D496 Neoplasm of unspecified behavior of brain: Secondary | ICD-10-CM

## 2021-03-01 ENCOUNTER — Other Ambulatory Visit: Payer: Self-pay

## 2021-03-01 ENCOUNTER — Ambulatory Visit (HOSPITAL_COMMUNITY)
Admission: RE | Admit: 2021-03-01 | Discharge: 2021-03-01 | Disposition: A | Discharge status: Home / Self Care | Payer: Managed Care, Other (non HMO) | Source: Ambulatory Visit | Attending: Neurological Surgery | Admitting: Neurological Surgery

## 2021-03-01 DIAGNOSIS — D496 Neoplasm of unspecified behavior of brain: Secondary | ICD-10-CM | POA: Diagnosis present

## 2021-03-01 IMAGING — MR MR HEAD WO/W CM
11 of 14 series · 22 of 48 positions shown · IV contrast (gadavist)
Comparison: [DATE]

CLINICAL DATA: Brain tumor follow-up

EXAM:
MRI HEAD WITHOUT AND WITH CONTRAST
TECHNIQUE: Multiplanar, multiecho pulse sequences of the brain and surrounding
structures were obtained without and with intravenous contrast.
CONTRAST:  7mL GADAVIST GADOBUTROL 1 MMOL/ML IV SOLN

[Series 2: FLAIR · sagittal · 3.0mm · 0.47mm/px · 1 of 48 slices shown (1 of 2)]
[im 1/48]
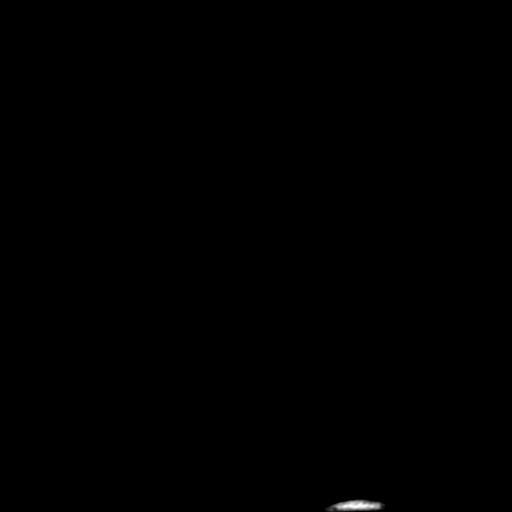

[Series 3: DWI · axial · 3.0mm · 0.94mm/px · z∈[-96,+74]mm · 3 of 116 slices shown]
[im 1/116]
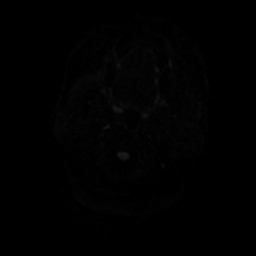
[im 58/116]
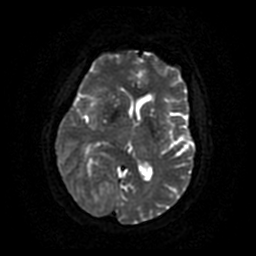
[im 116/116]
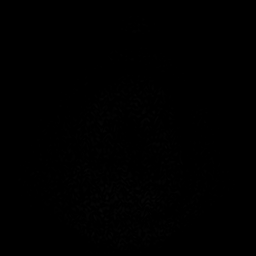

[Series 4: FLAIR · axial · 3.0mm · 0.51mm/px · 1 of 58 slices shown (2 of 2)]
[im 1/58]
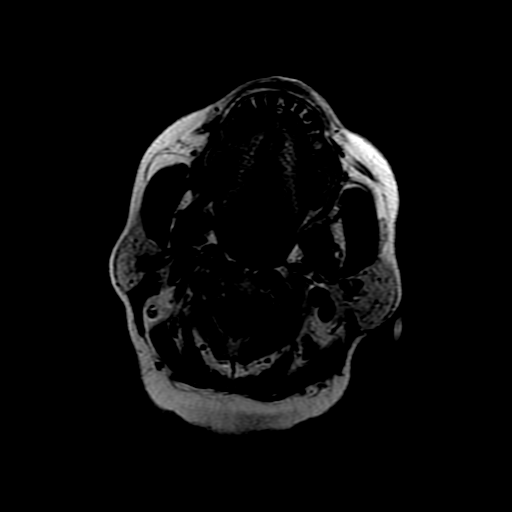

[Series 5: SWI · axial · 3.0mm · 0.47mm/px · z∈[-120,+49]mm · 3 of 116 slices shown (1 of 2)]
[im 1/116]
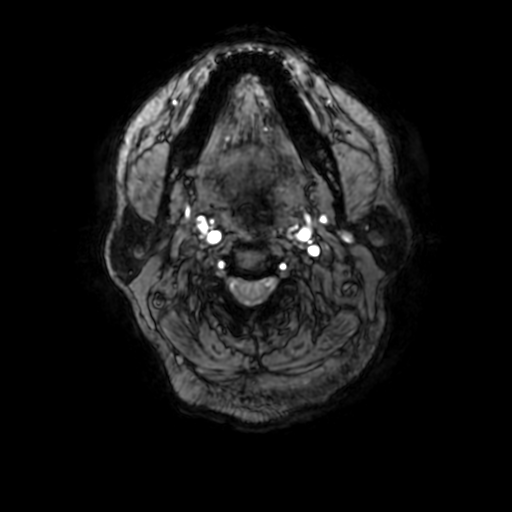
[im 58/116]
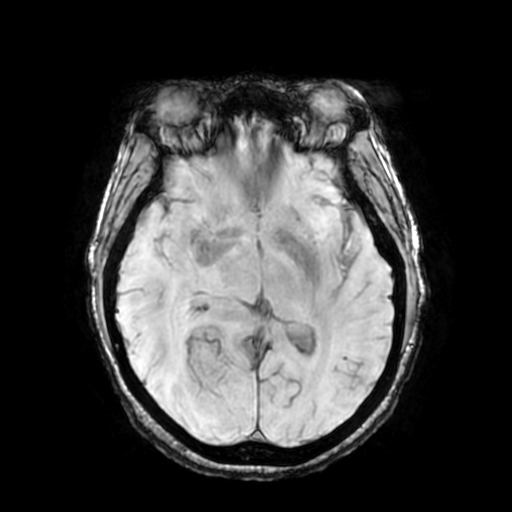
[im 116/116]
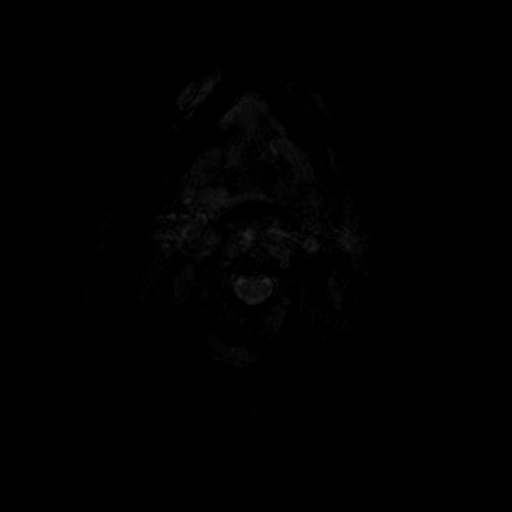

[Series 7: T2 post-contrast · coronal · 3.0mm · 0.39mm/px · 1 of 57 slices shown (1 of 2)]
[im 1/57]
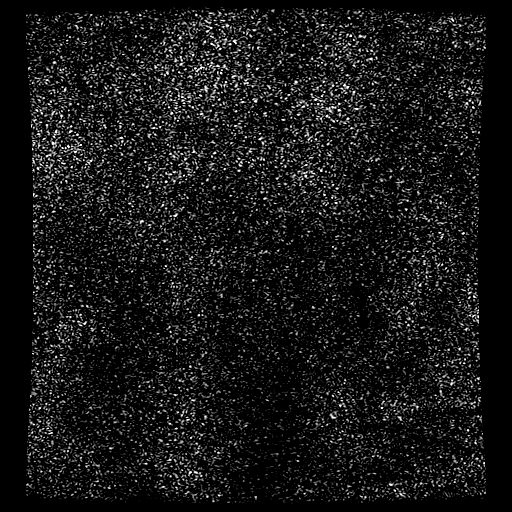

[Series 8: T2 post-contrast · axial · 5.0mm · 0.47mm/px · 1 of 29 slices shown (2 of 2)]
[im 1/29]
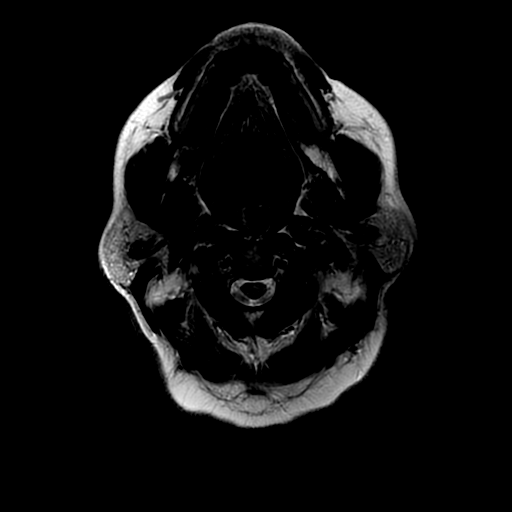

[Series 9: T1 post-contrast · coronal · 3.0mm · 0.43mm/px · 1 of 57 slices shown (1 of 2)]
[im 1/57]
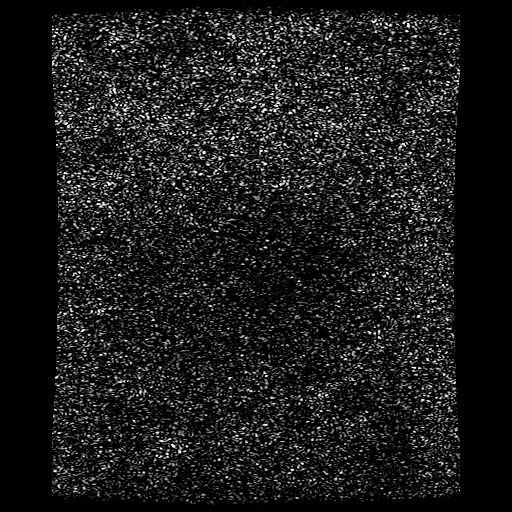

[Series 10: FLAIR post-contrast · sagittal · 3.0mm · 0.47mm/px · 1 of 48 slices shown]
[im 1/48]
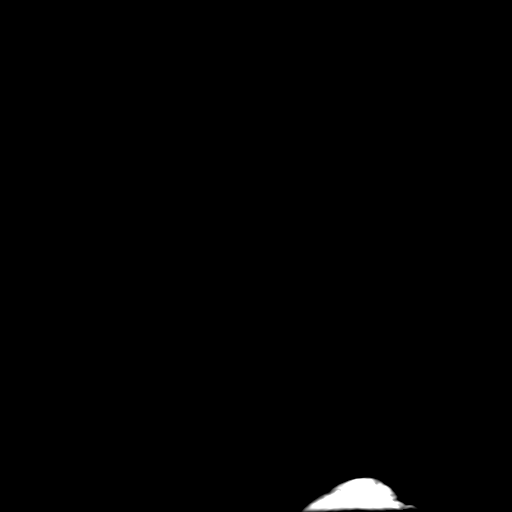

[Series 350: ADC · axial · 3.0mm · 0.94mm/px · 1 of 58 slices shown]
[im 1/58]
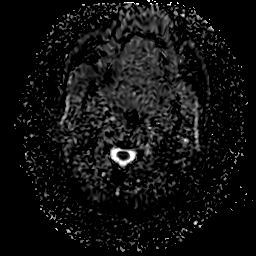

[Series 500: SWI · axial · 3.0mm · 0.47mm/px · z∈[-120,-36]mm · 2 of 115 slices shown (2 of 2)]
[im 1/115]
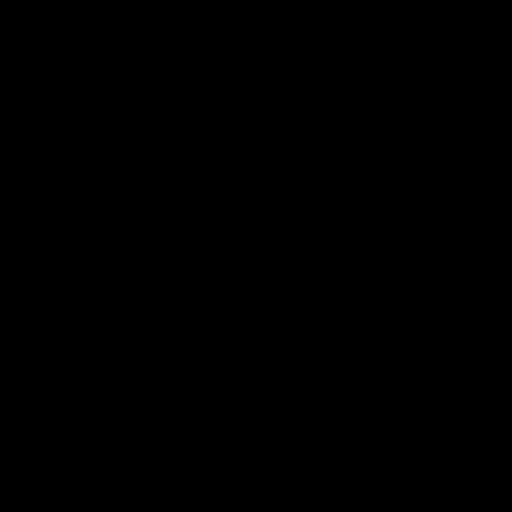
[im 58/115]
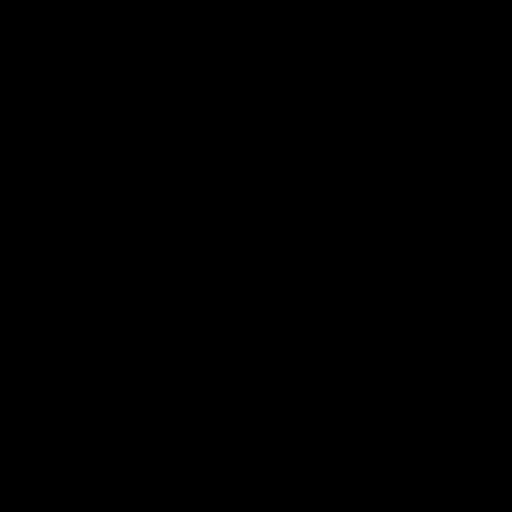

[Series 1100: T1 post-contrast · axial · 0.9mm · 0.50mm/px · z∈[-170,+86]mm · 7 of 302 slices shown (2 of 2)]
[im 1/302]
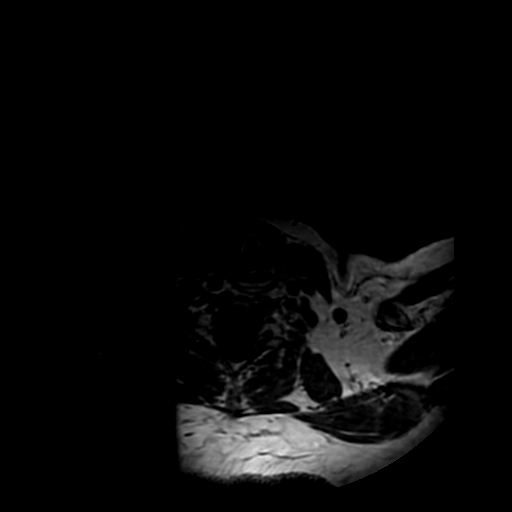
[im 51/302]
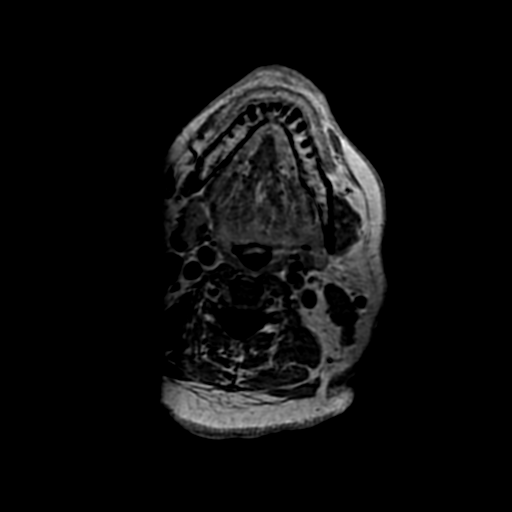
[im 101/302]
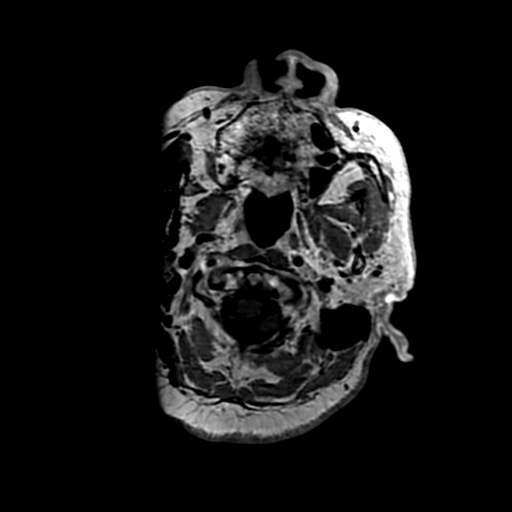
[im 151/302]
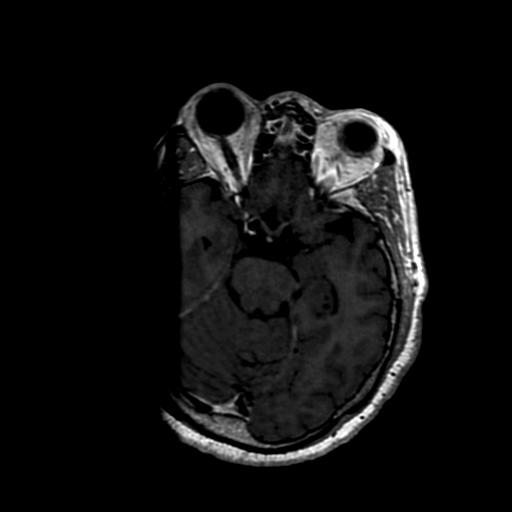
[im 201/302]
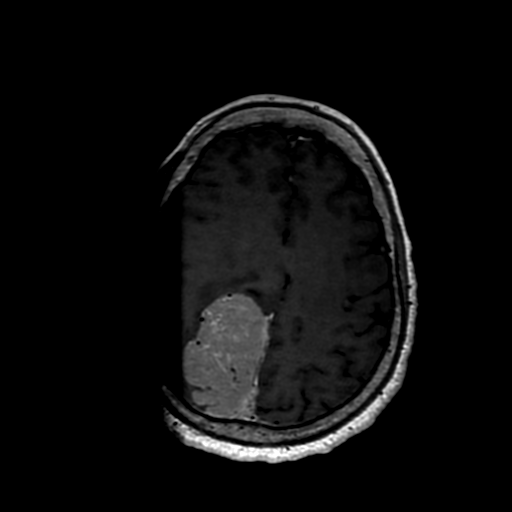
[im 251/302]
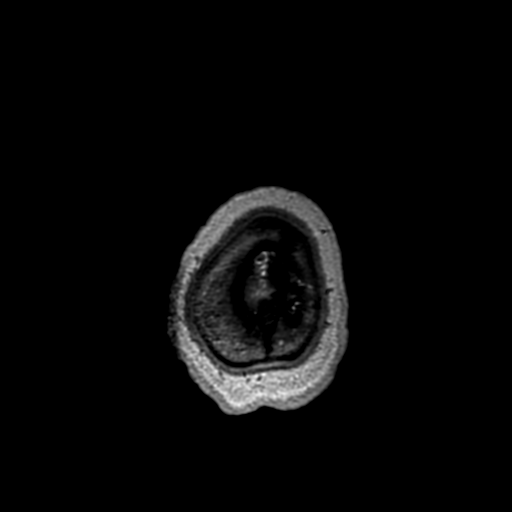
[im 302/302]
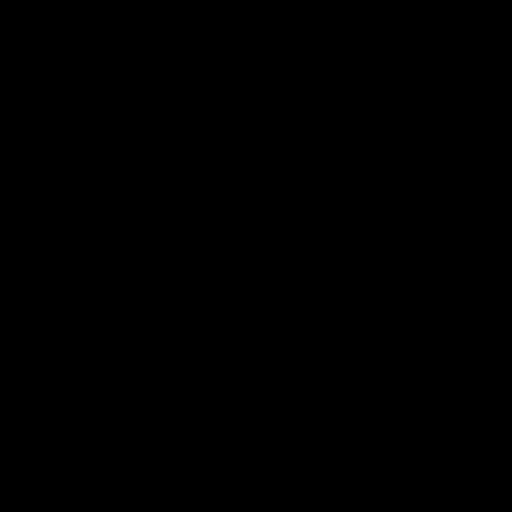

[22 of 48 positions shown; findings below may reference images not displayed]

FINDINGS: Brain: Known cellular and avidly enhancing mass in the extra-axial
space arising from the right parasagittal and posterior falx, 6 x
4.8 x 3.8 cm. There are macroscopic vessels primarily around the
mass, which may be normal cortical vessels. There is invasion of the
superior sagittal sinus affecting an approximately 3.7 cm long span
with severe stenosis. Best seen on the black blood sequence there is
still a tiny channel of flow along the posterior and left aspect of
the affected sinus. No second mass is seen. Similar degree of
vasogenic edema in the compressed right cerebrum. No incidental
infarct, entrapment, or hemorrhage.

Vascular: Superior sagittal sinus involvement as above

Skull and upper cervical spine: Negative for bony invasion.

Sinuses/Orbits: Negative
IMPRESSION: 1. 6 x 4.8 x 3.8 cm (enlarged from prior) presumed meningioma from
the right parasagittal falx centered at the level of the parietal
lobe. Segment of superior sagittal sinus invasion with severe
stenosis (a tiny channel of patent sinus is best seen on series
2. Vasogenic edema is similar to MAUCK although mass effect has
increased due to enlarging mass.

## 2021-03-01 MED ORDER — GADOBUTROL 1 MMOL/ML IV SOLN
7.0000 mL | Freq: Once | INTRAVENOUS | Status: AC | PRN
  Administered 2021-03-01: 7 mL via INTRAVENOUS
